# Patient Record
Sex: Male | Born: 1980 | Race: White | Hispanic: No | Marital: Single | Smoking: Current every day smoker
Health system: Southern US, Community
[De-identification: ages and names within clinical notes are randomized; demographics above are authoritative.]

## PROBLEM LIST (undated history)

## (undated) HISTORY — PX: HAND SURGERY: SHX662

---

## 2013-07-12 ENCOUNTER — Emergency Department (HOSPITAL_COMMUNITY): Payer: Self-pay

## 2013-07-12 ENCOUNTER — Emergency Department (HOSPITAL_COMMUNITY)
Admission: EM | Admit: 2013-07-12 | Discharge: 2013-07-12 | Disposition: A | Discharge status: Home / Self Care | Payer: Self-pay | Attending: Emergency Medicine | Admitting: Emergency Medicine

## 2013-07-12 ENCOUNTER — Encounter (HOSPITAL_COMMUNITY): Payer: Self-pay | Admitting: Emergency Medicine

## 2013-07-12 DIAGNOSIS — W132XXA Fall from, out of or through roof, initial encounter: Secondary | ICD-10-CM

## 2013-07-12 DIAGNOSIS — W138XXA Fall from, out of or through other building or structure, initial encounter: Secondary | ICD-10-CM | POA: Insufficient documentation

## 2013-07-12 DIAGNOSIS — S335XXA Sprain of ligaments of lumbar spine, initial encounter: Secondary | ICD-10-CM | POA: Insufficient documentation

## 2013-07-12 DIAGNOSIS — S7001XA Contusion of right hip, initial encounter: Secondary | ICD-10-CM

## 2013-07-12 DIAGNOSIS — Y9301 Activity, walking, marching and hiking: Secondary | ICD-10-CM | POA: Insufficient documentation

## 2013-07-12 DIAGNOSIS — S39012A Strain of muscle, fascia and tendon of lower back, initial encounter: Secondary | ICD-10-CM

## 2013-07-12 DIAGNOSIS — S7000XA Contusion of unspecified hip, initial encounter: Secondary | ICD-10-CM | POA: Insufficient documentation

## 2013-07-12 DIAGNOSIS — Y92009 Unspecified place in unspecified non-institutional (private) residence as the place of occurrence of the external cause: Secondary | ICD-10-CM | POA: Insufficient documentation

## 2013-07-12 MED ORDER — KETOROLAC TROMETHAMINE 60 MG/2ML IM SOLN
60.0000 mg | Freq: Once | INTRAMUSCULAR | Status: AC
  Administered 2013-07-12: 60 mg via INTRAMUSCULAR
  Filled 2013-07-12: qty 2

## 2013-07-12 MED ORDER — OXYCODONE-ACETAMINOPHEN 5-325 MG PO TABS: 1.0000 | ORAL_TABLET | Freq: Four times a day (QID) | ORAL | Status: AC | PRN

## 2013-07-12 MED ORDER — OXYCODONE-ACETAMINOPHEN 5-325 MG PO TABS
2.0000 | ORAL_TABLET | Freq: Once | ORAL | Status: AC
  Administered 2013-07-12: 2 via ORAL
  Filled 2013-07-12: qty 2

## 2013-07-12 MED ORDER — IBUPROFEN 800 MG PO TABS: 800.0000 mg | ORAL_TABLET | Freq: Three times a day (TID) | ORAL | Status: AC | PRN

## 2013-07-12 NOTE — Discharge Instructions (Signed)
Contusion A contusion is a deep bruise. Contusions are the result of an injury that caused bleeding under the skin. The contusion may turn blue, purple, or yellow. Minor injuries will give you a painless contusion, but more severe contusions may stay painful and swollen for a few weeks.  CAUSES  A contusion is usually caused by a blow, trauma, or direct force to an area of the body. SYMPTOMS   Swelling and redness of the injured area.  Bruising of the injured area.  Tenderness and soreness of the injured area.  Pain. DIAGNOSIS  The diagnosis can be made by taking a history and physical exam. An X-ray, CT scan, or MRI may be needed to determine if there were any associated injuries, such as fractures. TREATMENT  Specific treatment will depend on what area of the body was injured. In general, the best treatment for a contusion is resting, icing, elevating, and applying cold compresses to the injured area. Over-the-counter medicines may also be recommended for pain control. Ask your caregiver what the best treatment is for your contusion. HOME CARE INSTRUCTIONS   Put ice on the injured area.  Put ice in a plastic bag.  Place a towel between your skin and the bag.  Leave the ice on for 15-20 minutes, 03-04 times a day.  Only take over-the-counter or prescription medicines for pain, discomfort, or fever as directed by your caregiver. Your caregiver may recommend avoiding anti-inflammatory medicines (aspirin, ibuprofen, and naproxen) for 48 hours because these medicines may increase bruising.  Rest the injured area.  If possible, elevate the injured area to reduce swelling. SEEK IMMEDIATE MEDICAL CARE IF:   You have increased bruising or swelling.  You have pain that is getting worse.  Your swelling or pain is not relieved with medicines. MAKE SURE YOU:   Understand these instructions.  Will watch your condition.  Will get help right away if you are not doing well or get  worse. Document Released: 01/29/2005 Document Revised: 07/14/2011 Document Reviewed: 02/24/2011 [facility] Patient Information 2014 [place], [place].  Back Pain, Adult Low back pain is very common. About 1 in 5 people have back pain.The cause of low back pain is rarely dangerous. The pain often gets better over time.About half of people with a sudden onset of back pain feel better in just 2 weeks. About 8 in 10 people feel better by 6 weeks.  CAUSES Some common causes of back pain include:  Strain of the muscles or ligaments supporting the spine.  Wear and tear (degeneration) of the spinal discs.  Arthritis.  Direct injury to the back. DIAGNOSIS Most of the time, the direct cause of low back pain is not known.However, back pain can be treated effectively even when the exact cause of the pain is unknown.Answering your caregiver's questions about your overall health and symptoms is one of the most accurate ways to make sure the cause of your pain is not dangerous. If your caregiver needs more information, he or she may order lab work or imaging tests (X-rays or MRIs).However, even if imaging tests show changes in your back, this usually does not require surgery. HOME CARE INSTRUCTIONS For many people, back pain returns.Since low back pain is rarely dangerous, it is often a condition that people can learn to [facility] their own.   Remain active. It is stressful on the back to sit or stand in one place. Do not sit, drive, or stand in one place for more than 30 minutes at a time.  Take short walks on level surfaces as soon as pain allows.Try to increase the length of time you walk each day.  Do not stay in bed.Resting more than 1 or 2 days can delay your recovery.  Do not avoid exercise or work.Your body is made to move.It is not dangerous to be active, even though your back may hurt.Your back will likely heal faster if you return to being active before your pain is gone.  Pay attention  to your body when you bend and lift. Many people have less discomfortwhen lifting if they bend their knees, keep the load close to their bodies,and avoid twisting. Often, the most comfortable positions are those that put less stress on your recovering back.  Find a comfortable position to sleep. Use a firm mattress and lie on your side with your knees slightly bent. If you lie on your back, put a pillow under your knees.  Only take over-the-counter or prescription medicines as directed by your caregiver. Over-the-counter medicines to reduce pain and inflammation are often the most helpful.Your caregiver may prescribe muscle relaxant drugs.These medicines help dull your pain so you can more quickly return to your normal activities and healthy exercise.  Put ice on the injured area.  Put ice in a plastic bag.  Place a towel between your skin and the bag.  Leave the ice on for 15-20 minutes, 03-04 times a day for the first 2 to 3 days. After that, ice and heat may be alternated to reduce pain and spasms.  Ask your caregiver about trying back exercises and gentle massage. This may be of some benefit.  Avoid feeling anxious or stressed.Stress increases muscle tension and can worsen back pain.It is important to recognize when you are anxious or stressed and learn ways to manage it.Exercise is a great option. SEEK MEDICAL CARE IF:  You have pain that is not relieved with rest or medicine.  You have pain that does not improve in 1 week.  You have new symptoms.  You are generally not feeling well. SEEK IMMEDIATE MEDICAL CARE IF:   You have pain that radiates from your back into your legs.  You develop new bowel or bladder control problems.  You have unusual weakness or numbness in your arms or legs.  You develop nausea or vomiting.  You develop abdominal pain.  You feel faint. Document Released: 04/21/2005 Document Revised: 10/21/2011 Document Reviewed: 09/09/2010 [facility]  Patient Information 2014 [place].

## 2013-07-12 NOTE — ED Provider Notes (Signed)
CSN: [id number]     Arrival date & time 07/12/13  1924 History   First MD Initiated Contact with Patient 07/12/13 1933     Chief Complaint  Patient presents with  . Fall     (Consider location/radiation/quality/duration/timing/severity/associated sxs/prior Treatment) Patient is a 33 y.o. male presenting with fall.  Fall   Pt reports he was working on framing a new house, walking on the top of a wall, when he lost his balance and fell about 10 feet onto a plywood floor, landing on his R side. Complaining of severe aching pain in R hip and lower back, worse with movement. He called his wife who came to get him and then brought him to the ED for evaluation. Denies head injury or LOC.   History reviewed. No pertinent past medical history. No past surgical history on file. No family history on file. History  Substance Use Topics  . Smoking status: Not on file  . Smokeless tobacco: Not on file  . Alcohol Use: Not on file    Review of Systems  All other systems reviewed and are negative except as noted in HPI.    Allergies  Review of patient's allergies indicates no known allergies.  Home Medications  No current outpatient prescriptions on file. BP 129/89  Pulse 96  Temp(Src) 98.5 F (36.9 C) (Oral)  Resp 24  Wt 200 lb (90.719 kg)  SpO2 100% Physical Exam  Nursing note and vitals reviewed. Constitutional: He is oriented to person, place, and time. He appears well-developed and well-nourished.  HENT:  Head: Normocephalic and atraumatic.  Eyes: EOM are normal. Pupils are equal, round, and reactive to light.  Neck:  Placed in c-collar in triage, no neck pain but distracting injury, does not clear by NEXUS  Cardiovascular: Normal rate, normal heart sounds and intact distal pulses.   Pulmonary/Chest: Effort normal and breath sounds normal.  Abdominal: Bowel sounds are normal. He exhibits no distension. There is no tenderness.  Musculoskeletal: He exhibits tenderness (tender  diffuse lower back and R hip, worse with ROM leg and palpation). He exhibits no edema.  Neurological: He is alert and oriented to person, place, and time. He has normal strength. No cranial nerve deficit or sensory deficit.  Skin: Skin is warm and dry. No rash noted.  Psychiatric: He has a normal mood and affect.    ED Course  Procedures (including critical care time) Labs Review Labs Reviewed - No data to display Imaging Review Dg Lumbar Spine Complete  07/12/2013   CLINICAL DATA Lower back pain status post fall.  EXAM LUMBAR SPINE - COMPLETE 4+ VIEW  COMPARISON 05/22/2013 CT and radiographs of the lumbar spine  FINDINGS Rightward curvature of the lumbar spine may be accentuated by positioning. Multilevel degenerative changes, most pronounced at L1-2. 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 No displaced fracture or dislocation identified.  IMPRESSION Rightward curvature may be accentuated by positioning.  Multilevel degenerative change, most pronounced at L1-2, similar to prior. L5 limbus vertebrae again noted.  SIGNATURE  Electronically Signed   By: Jearld Lesch M.D.   On: 07/12/2013 21:55   Dg Hip Complete Right  07/12/2013   CLINICAL DATA Right lateral hip/pelvis pain, recent fall.  EXAM RIGHT HIP - COMPLETE 2+ VIEW  COMPARISON 05/22/2013 lumbar spine radiographs  FINDINGS Mild cortical irregularity along the right anterior inferior iliac spine is similar to prior and may reflect sequelae of remote injury. The right femur is rotated, limiting evaluation. Femoral head remains seated within the acetabulum.  No displaced fracture identified. Rami intact. SI joints symmetric.  IMPRESSION Limited by rotation. No displaced fracture of the right hip identified.  Recommend MRI if concern for a nondisplaced fracture or radiographically occult pathology persists.  SIGNATURE  Electronically Signed   By: Jearld LeschAndrew  DelGaizo M.D.   On: 07/12/2013 21:52   Ct Cervical Spine Wo Contrast  07/12/2013   CLINICAL DATA Fell from  roof approximately 10 feet, no loss of consciousness, denies striking head  EXAM CT CERVICAL SPINE WITHOUT CONTRAST  TECHNIQUE Multidetector CT imaging of the cervical spine was performed without intravenous contrast. Multiplanar CT image reconstructions were also generated.  COMPARISON None  FINDINGS Visualized skullbase intact.  Lung apices clear.  Osseous mineralization normal.  Prevertebral soft tissues normal thickness.  Vertebral body and disc space heights maintained.  No acute fracture, subluxation or bone destruction.  Soft tissues grossly unremarkable.  IMPRESSION No acute cervical spine abnormalities.  SIGNATURE  Electronically Signed   By: Ulyses SouthwardMark  Boles M.D.   On: 07/12/2013 21:04     EKG Interpretation None      MDM   Final diagnoses:  Fall from roof  Contusion of right hip  Lumbar strain    Imaging neg, collar removed. Low suspicion for occult hip fracture. Pt had initial relief with Percocet but pain returned while in xray. No improvement with toradol, but he wants to go home. He was advised to rest for a few days and return if not improving.     Charles B. Bernette MayersSheldon, MD 07/12/13 2250

## 2013-07-12 NOTE — ED Notes (Signed)
Pt in stating he fell off his roof tonight, landed on his right hip and side, c/o right hip, lower back and flank pain, denies hitting head or LOC, states roof is approx 10 ft, alert and oriented at this time, denies neck pain, c-collar applied in triage

## 2014-04-23 ENCOUNTER — Encounter (HOSPITAL_COMMUNITY): Payer: Self-pay | Admitting: Emergency Medicine

## 2014-04-23 ENCOUNTER — Emergency Department (HOSPITAL_COMMUNITY)
Admission: EM | Admit: 2014-04-23 | Discharge: 2014-04-23 | Disposition: A | Discharge status: Home / Self Care | Payer: Self-pay | Attending: Emergency Medicine | Admitting: Emergency Medicine

## 2014-04-23 ENCOUNTER — Emergency Department (HOSPITAL_COMMUNITY): Payer: Self-pay

## 2014-04-23 DIAGNOSIS — R112 Nausea with vomiting, unspecified: Secondary | ICD-10-CM | POA: Insufficient documentation

## 2014-04-23 DIAGNOSIS — Z720 Tobacco use: Secondary | ICD-10-CM | POA: Insufficient documentation

## 2014-04-23 DIAGNOSIS — R1030 Lower abdominal pain, unspecified: Secondary | ICD-10-CM | POA: Insufficient documentation

## 2014-04-23 DIAGNOSIS — R630 Anorexia: Secondary | ICD-10-CM | POA: Insufficient documentation

## 2014-04-23 DIAGNOSIS — R61 Generalized hyperhidrosis: Secondary | ICD-10-CM | POA: Insufficient documentation

## 2014-04-23 DIAGNOSIS — R109 Unspecified abdominal pain: Secondary | ICD-10-CM

## 2014-04-23 LAB — CBC WITH DIFFERENTIAL/PLATELET
BASOS ABS: 0 10*3/uL (ref 0.0–0.1)
Basophils Relative: 1 % (ref 0–1)
EOS PCT: 1 % (ref 0–5)
Eosinophils Absolute: 0.1 10*3/uL (ref 0.0–0.7)
HCT: 44.4 % (ref 39.0–52.0)
Hemoglobin: 15.9 g/dL (ref 13.0–17.0)
Lymphocytes Relative: 32 % (ref 12–46)
Lymphs Abs: 2.1 10*3/uL (ref 0.7–4.0)
MCH: 32.7 pg (ref 26.0–34.0)
MCHC: 35.8 g/dL (ref 30.0–36.0)
MCV: 91.4 fL (ref 78.0–100.0)
MONO ABS: 0.6 10*3/uL (ref 0.1–1.0)
MONOS PCT: 9 % (ref 3–12)
Neutro Abs: 3.9 10*3/uL (ref 1.7–7.7)
Neutrophils Relative %: 57 % (ref 43–77)
Platelets: 197 10*3/uL (ref 150–400)
RBC: 4.86 MIL/uL (ref 4.22–5.81)
RDW: 12.5 % (ref 11.5–15.5)
WBC: 6.6 10*3/uL (ref 4.0–10.5)

## 2014-04-23 LAB — URINALYSIS, ROUTINE W REFLEX MICROSCOPIC
Glucose, UA: NEGATIVE mg/dL
HGB URINE DIPSTICK: NEGATIVE
Leukocytes, UA: NEGATIVE
Nitrite: NEGATIVE
PH: 6.5 (ref 5.0–8.0)
Protein, ur: NEGATIVE mg/dL
SPECIFIC GRAVITY, URINE: 1.01 (ref 1.005–1.030)
Urobilinogen, UA: 1 mg/dL (ref 0.0–1.0)

## 2014-04-23 LAB — COMPREHENSIVE METABOLIC PANEL
ALBUMIN: 4.5 g/dL (ref 3.5–5.2)
ALK PHOS: 47 U/L (ref 39–117)
ALT: 29 U/L (ref 0–53)
AST: 22 U/L (ref 0–37)
Anion gap: 19 — ABNORMAL HIGH (ref 5–15)
BILIRUBIN TOTAL: 0.6 mg/dL (ref 0.3–1.2)
BUN: 15 mg/dL (ref 6–23)
CO2: 19 meq/L (ref 19–32)
CREATININE: 0.82 mg/dL (ref 0.50–1.35)
Calcium: 9.8 mg/dL (ref 8.4–10.5)
Chloride: 100 mEq/L (ref 96–112)
GFR calc non Af Amer: >90 mL/min (ref >90)
Glucose, Bld: 115 mg/dL — ABNORMAL HIGH (ref 70–99)
Potassium: 3.5 mEq/L — ABNORMAL LOW (ref 3.7–5.3)
Sodium: 138 mEq/L (ref 137–147)
Total Protein: 7.9 g/dL (ref 6.0–8.3)

## 2014-04-23 LAB — LIPASE, BLOOD: Lipase: 15 U/L (ref 11–59)

## 2014-04-23 MED ORDER — OXYCODONE-ACETAMINOPHEN 5-325 MG PO TABS: 2.0000 | ORAL_TABLET | ORAL | Status: AC | PRN

## 2014-04-23 MED ORDER — KETOROLAC TROMETHAMINE 30 MG/ML IJ SOLN
30.0000 mg | Freq: Once | INTRAMUSCULAR | Status: AC
  Administered 2014-04-23: 30 mg via INTRAVENOUS
  Filled 2014-04-23: qty 1

## 2014-04-23 MED ORDER — HYDROMORPHONE HCL 1 MG/ML IJ SOLN
1.0000 mg | Freq: Once | INTRAMUSCULAR | Status: AC
  Administered 2014-04-23: 1 mg via INTRAVENOUS
  Filled 2014-04-23: qty 1

## 2014-04-23 MED ORDER — IOHEXOL 300 MG/ML  SOLN
100.0000 mL | Freq: Once | INTRAMUSCULAR | Status: AC | PRN
  Administered 2014-04-23: 100 mL via INTRAVENOUS

## 2014-04-23 MED ORDER — SODIUM CHLORIDE 0.9 % IV BOLUS (SEPSIS)
500.0000 mL | Freq: Once | INTRAVENOUS | Status: AC
  Administered 2014-04-23: 500 mL via INTRAVENOUS

## 2014-04-23 MED ORDER — ONDANSETRON HCL 4 MG/2ML IJ SOLN
4.0000 mg | Freq: Once | INTRAMUSCULAR | Status: AC
  Administered 2014-04-23: 4 mg via INTRAVENOUS

## 2014-04-23 MED ORDER — PROMETHAZINE HCL 25 MG PO TABS: 25.0000 mg | ORAL_TABLET | Freq: Four times a day (QID) | ORAL | Status: AC | PRN

## 2014-04-23 MED ORDER — PROMETHAZINE HCL 25 MG/ML IJ SOLN
12.5000 mg | Freq: Once | INTRAMUSCULAR | Status: AC
  Administered 2014-04-23: 12.5 mg via INTRAVENOUS
  Filled 2014-04-23: qty 1

## 2014-04-23 MED ORDER — ONDANSETRON HCL 4 MG/2ML IJ SOLN
4.0000 mg | Freq: Once | INTRAMUSCULAR | Status: DC
  Filled 2014-04-23: qty 2

## 2014-04-23 MED ORDER — HYDROMORPHONE HCL 1 MG/ML IJ SOLN: 1.0000 mg | Freq: Once | INTRAMUSCULAR | Status: DC

## 2014-04-23 MED ORDER — SODIUM CHLORIDE 0.9 % IV BOLUS (SEPSIS)
1000.0000 mL | Freq: Once | INTRAVENOUS | Status: AC
  Administered 2014-04-23: 1000 mL via INTRAVENOUS

## 2014-04-23 MED ORDER — ONDANSETRON HCL 4 MG/2ML IJ SOLN
4.0000 mg | Freq: Once | INTRAMUSCULAR | Status: AC
  Administered 2014-04-23: 4 mg via INTRAVENOUS
  Filled 2014-04-23: qty 2

## 2014-04-23 MED ORDER — SODIUM CHLORIDE 0.9 % IV BOLUS (SEPSIS): 1000.0000 mL | Freq: Once | INTRAVENOUS | Status: DC

## 2014-04-23 MED ORDER — MORPHINE SULFATE 4 MG/ML IJ SOLN
4.0000 mg | Freq: Once | INTRAMUSCULAR | Status: AC
  Administered 2014-04-23: 4 mg via INTRAVENOUS
  Filled 2014-04-23: qty 1

## 2014-04-23 NOTE — Discharge Instructions (Signed)
CT scan showed no acute findings. Medication for pain and nausea.  Return if worse

## 2014-04-23 NOTE — ED Notes (Signed)
Patient upset about waiting, MD at bedside to explain results. Patient aggreeable to getting fluids and providing urine sample.

## 2014-04-23 NOTE — ED Notes (Addendum)
Patient upset about negative test results, feels he is not getting help,  ripped IV out, got dressed, being disrespectul to staff and making a scene around nurses station. Security called. EDP aware.

## 2014-04-23 NOTE — ED Provider Notes (Signed)
CSN: [id number]     Arrival date & time 04/23/14  0802 History  This chart was scribed for Daniel HutchingBrian Torez Beauregard, MD by Ronney LionSuzanne Le, ED Scribe. This patient was seen in room APA18/APA18 and the patient's care was started at 8:21 AM.    Chief Complaint  Patient presents with  . Abdominal Pain   The history is provided by the patient and a significant other. No language interpreter was used.    HPI Comments: Daniel SarksRandy Calhoun is a 33 y.o. male who presents to the Emergency Department complaining of intermittent, severe, central lower abdominal pain that began 3 days ago. Per fiancee, [facility] [place] after vomiting all day and experiencing the same pain as today. He was given blood tests and an XR chest; his fiancee states that he was given no CT scan. He went home, and his pain and vomiting resolved somewhat over the next 2 days, although he still had loss of appetite. Patient's pain returned today. Patient's fiancee states that he has experienced this same pain in the past, and he was told he had food poisoning. Patient has chronic back pain and is a smoker; otherwise, he is healthy in general, per fiancee. [name] denies a history of DM, cancer, kidney stones, or abdominal surgery. She denies any medication or known allergies to medication. She denies injury to the area. She denies EtOH consumption. Patient denies flank and testicular pain.       Pain appears to be worse after a bowel movement. This is happened 2 other times before.   History reviewed. No pertinent past medical history. Past Surgical History  Procedure Laterality Date  . Hand surgery     History reviewed. No pertinent family history. History  Substance Use Topics  . Smoking status: Current Every Day Smoker -- 1.50 packs/day for 15 years    Types: Cigarettes  . Smokeless tobacco: Never Used  . Alcohol Use: No    Review of Systems  Constitutional: Positive for diaphoresis and appetite  change.  Gastrointestinal: Positive for nausea, vomiting and abdominal pain.  Genitourinary: Negative for flank pain and testicular pain.  All other systems reviewed and are negative.     Allergies  Review of patient's allergies indicates no known allergies.  Home Medications   Prior to Admission medications   Medication Sig Start Date End Date Taking? Authorizing Provider  ibuprofen (ADVIL,MOTRIN) 800 MG tablet Take 1 tablet (800 mg total) by mouth every 8 (eight) hours as needed. Patient not taking: Reported on 04/23/2014 07/12/13   Charles B. Bernette MayersSheldon, MD  oxyCODONE-acetaminophen (PERCOCET) 5-325 MG per tablet Take 2 tablets by mouth every 4 (four) hours as needed. 04/23/14   Daniel HutchingBrian Winola Drum, MD  oxyCODONE-acetaminophen (PERCOCET/ROXICET) 5-325 MG per tablet Take 1-2 tablets by mouth every 6 (six) hours as needed for severe pain. Patient not taking: Reported on 04/23/2014 07/12/13   Charles B. Bernette MayersSheldon, MD  promethazine (PHENERGAN) 25 MG tablet Take 1 tablet (25 mg total) by mouth every 6 (six) hours as needed. 04/23/14   Daniel HutchingBrian Sander Remedios, MD   BP 101/51 mmHg  Pulse 67  Temp(Src) 98.7 F (37.1 C) (Oral)  Resp 0  Ht 5\' 10"  (1.778 m)  Wt 180 lb (81.647 kg)  BMI 25.83 kg/m2  SpO2 100% Physical Exam  Constitutional: He is oriented to person, place, and time. He appears well-developed and well-nourished.  HENT:  Head: Normocephalic and atraumatic.  Eyes: Conjunctivae and EOM are  normal. Pupils are equal, round, and reactive to light.  Neck: Normal range of motion. Neck supple.  Cardiovascular: Normal rate, regular rhythm and normal heart sounds.   Pulmonary/Chest: Effort normal and breath sounds normal.  Abdominal: Soft. Bowel sounds are normal. There is tenderness.  Tenderness to central lower abdomen.  Musculoskeletal: Normal range of motion. He exhibits no tenderness.  No tenderness to lower back or flank.  Neurological: He is alert and oriented to person, place, and time.  Skin: Skin  is warm and dry.  Psychiatric: He has a normal mood and affect. His behavior is normal.  Nursing note and vitals reviewed.   ED Course  Procedures (including critical care time)  DIAGNOSTIC STUDIES: Oxygen Saturation is 100% on room air, normal by my interpretation.    COORDINATION OF CARE: 8:27 AM - Discussed treatment plan with pt at bedside which includes CT abdomen and pt agreed to plan.   Labs Review Labs Reviewed  COMPREHENSIVE METABOLIC PANEL - Abnormal; Notable for the following:    Potassium 3.5 (*)    Glucose, Bld 115 (*)    Anion gap 19 (*)    All other components within normal limits  CBC WITH DIFFERENTIAL  LIPASE, BLOOD  URINALYSIS, ROUTINE W REFLEX MICROSCOPIC    Imaging Review Ct Abdomen Pelvis W Contrast  04/23/2014   CLINICAL DATA:  Intermittent central lower abdominal pain x3 days  EXAM: CT ABDOMEN AND PELVIS WITH CONTRAST  TECHNIQUE: Multidetector CT imaging of the abdomen and pelvis was performed using the standard protocol following bolus administration of intravenous contrast.  CONTRAST:  100mL OMNIPAQUE IOHEXOL 300 MG/ML  SOLN  COMPARISON:  None.  FINDINGS: Lower chest:  Lung bases are clear.  Hepatobiliary: Mildly heterogeneous perfusion liver without suspicious enhancing lesion 6 x 16 mm calcified lesion overlying the posterior right hepatic lobe (series 2/ image 12), benign.  Gallbladder is unremarkable. No intrahepatic or extrahepatic ductal dilatation.  Pancreas: Within normal limits.  Spleen: Within normal limits.  Adrenals/Urinary Tract: Adrenal gland are unremarkable.  Kidneys within normal limits.  No hydronephrosis.  Bladder is underdistended but unremarkable.  Stomach/Bowel: Stomach is unremarkable.  No evidence of bowel obstruction.  Normal appendix.  Vascular/Lymphatic: No evidence of abdominal aortic aneurysm.  No suspicious abdominopelvic lymphadenopathy.  Reproductive: Grossly unremarkable by CT. Mildly heterogeneous enhancement of the left  peripheral gland, nonspecific.  Other: No abdominopelvic ascites.  Musculoskeletal: Mild degenerative changes of the visualized thoracolumbar spine. Limbus vertebra along the anterior superior corner of L5.  IMPRESSION: No evidence of bowel obstruction.  Normal appendix.  No CT findings to account for the patient's severe lower abdominal pain.   Electronically Signed   By: Charline BillsSriyesh  Krishnan M.D.   On: 04/23/2014 10:16     EKG Interpretation None      MDM   Final diagnoses:  Abdominal pain   Patient has normal vital signs. CT scan shows no obvious bowel production. Labs normal. Patient feels better after pain management. Discussed findings with patient and his wife. Patient wanted to leave before urinalysis was finished  I personally performed the services described in this documentation, which was scribed in my presence. The recorded information has been reviewed and is accurate.       Daniel HutchingBrian Arvin Abello, MD 04/23/14 [id number]

## 2014-04-23 NOTE — ED Notes (Signed)
Patient c/o lower abd pain that radiates into right testicle since Friday. Patient states pain started when patient went to bathroom to have BM. Per wife diaphoretic.  Per wife nausea and vomiting constantly. Wife states that patient went to ER on Thursday, was unable to find anything but patient was asked if he had kidney stones. Patient dry heaving and moaning in pain.

## 2015-05-06 IMAGING — CT CT ABD-PELV W/ CM
2 of 4 series · 16 of 46 positions shown, 18 images · IV contrast (Omnipaque 300)
Comparison: None.

CLINICAL DATA: Intermittent central lower abdominal pain x3 days

EXAM:
CT ABDOMEN AND PELVIS WITH CONTRAST
TECHNIQUE: Multidetector CT imaging of the abdomen and pelvis was performed
using the standard protocol following bolus administration of
intravenous contrast.
CONTRAST:  100mL OMNIPAQUE IOHEXOL 300 MG/ML  SOLN

[Series 2: abd_pel_with 5.0 b40f · axial · 0.82mm/px · z∈[-488,-63]mm · 13 of 95 slices shown, 15 images]
[im 5/95  soft-tissue]
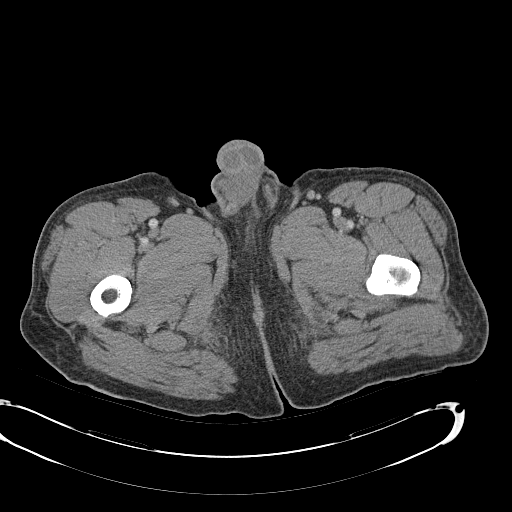
[im 5/95  bone]
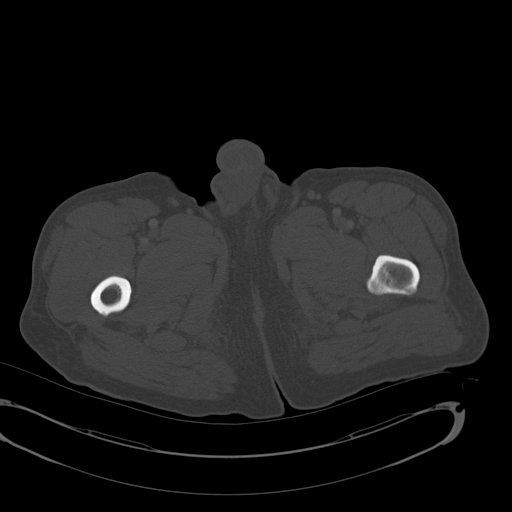
[im 14/95  soft-tissue]
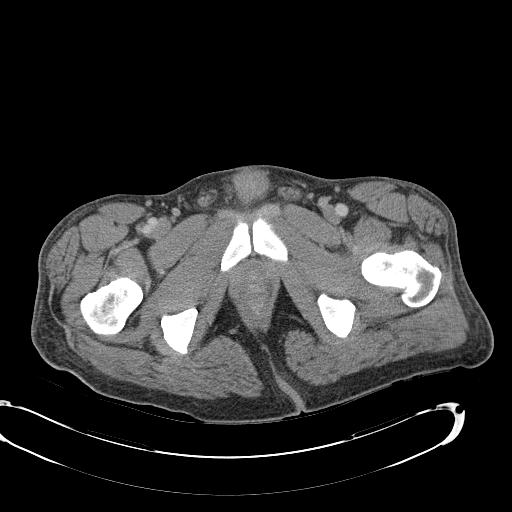
[im 18/95  soft-tissue]
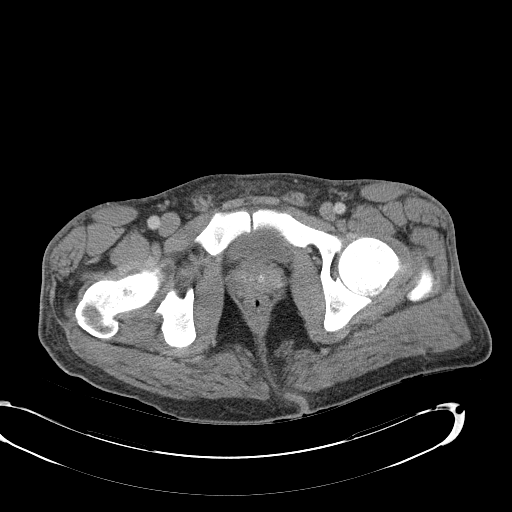
[im 27/95  soft-tissue]
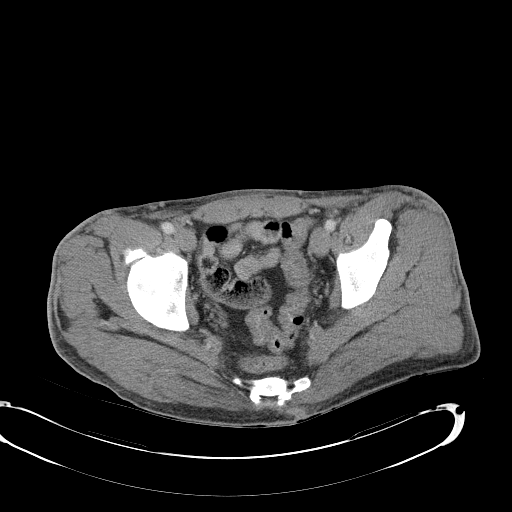
[im 32/95  soft-tissue]
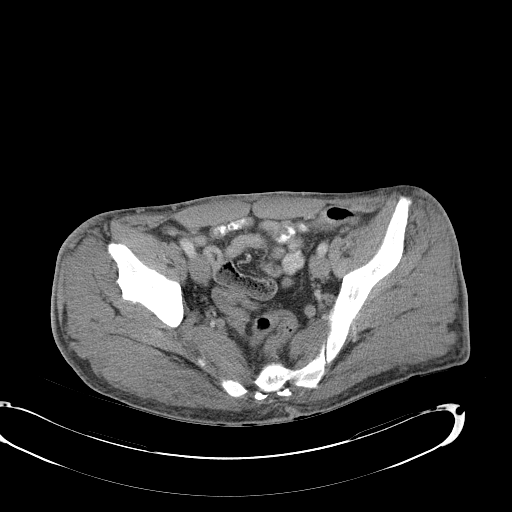
[im 41/95  soft-tissue]
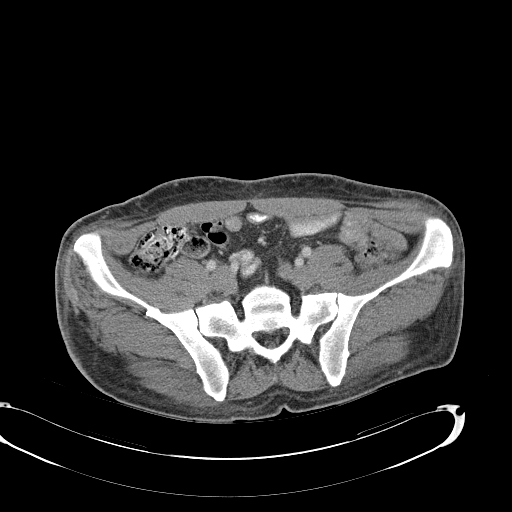
[im 50/95  soft-tissue]
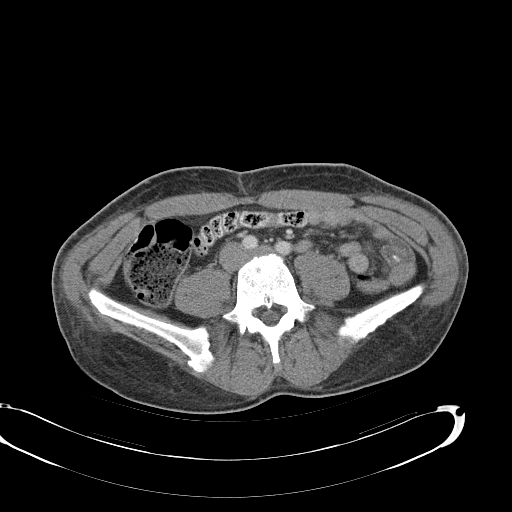
[im 54/95  soft-tissue]
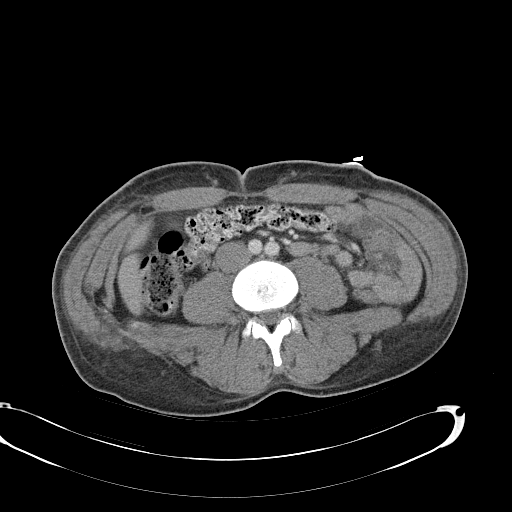
[im 63/95  soft-tissue]
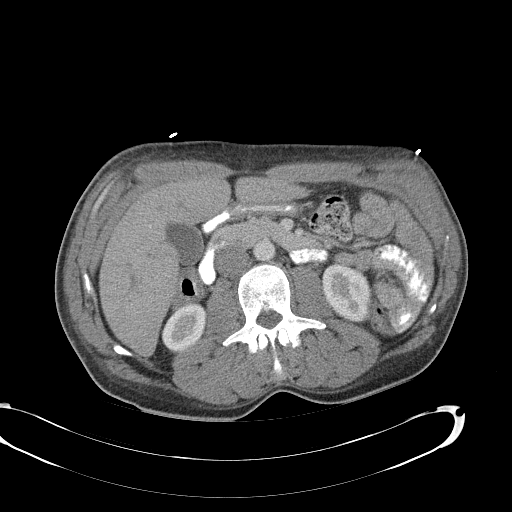
[im 63/95  bone]
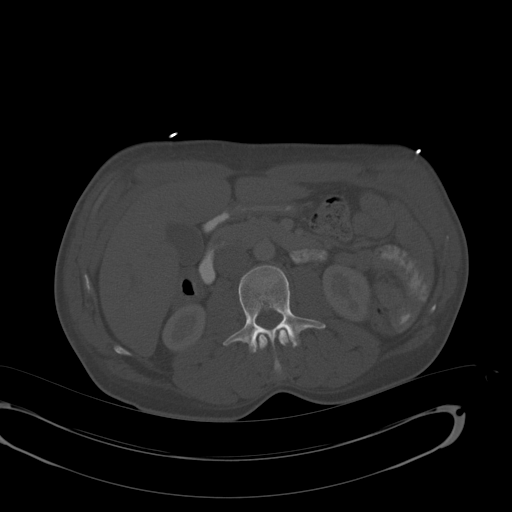
[im 68/95  soft-tissue]
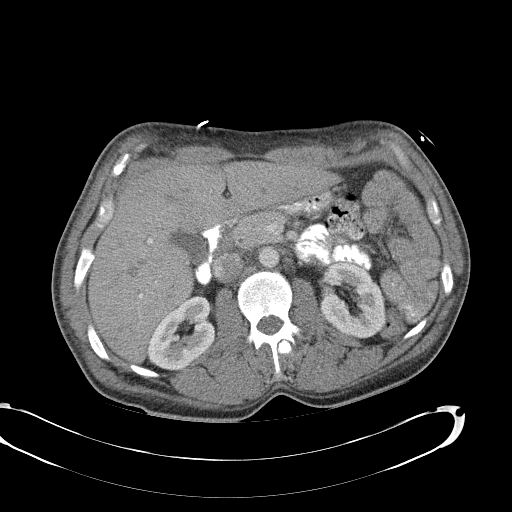
[im 77/95  soft-tissue]
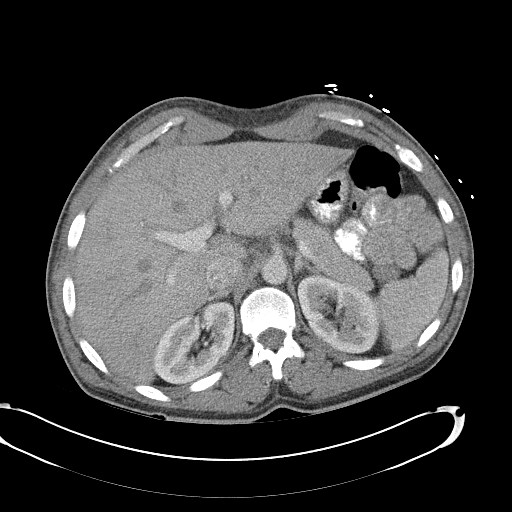
[im 81/95  soft-tissue]
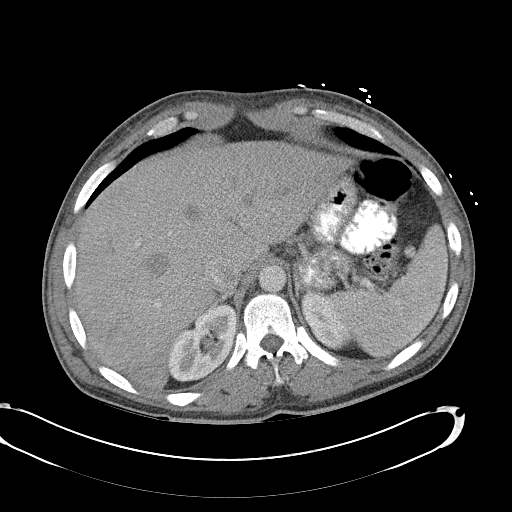
[im 90/95  soft-tissue]
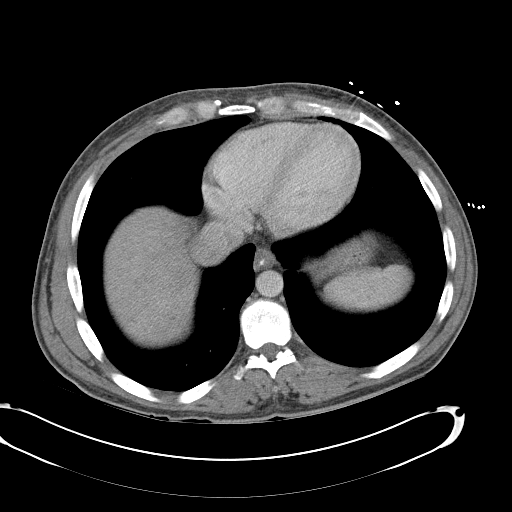

[Series 3: abd_pel_with 3.0 spo cor · coronal · 0.78mm/px · 3 of 82 slices shown]
[im 28/82  soft-tissue]
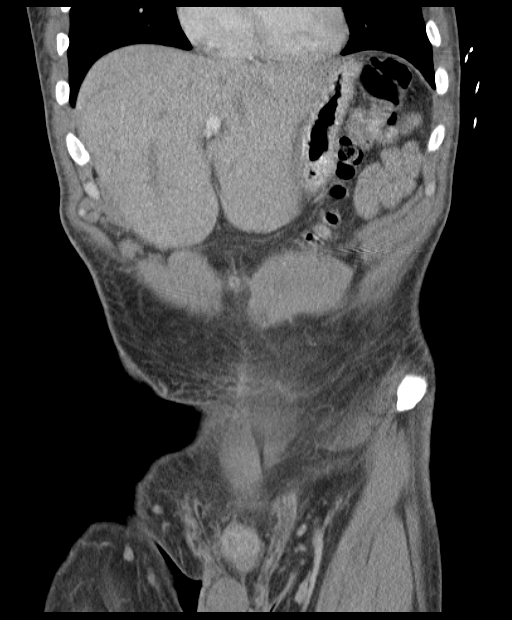
[im 37/82  soft-tissue]
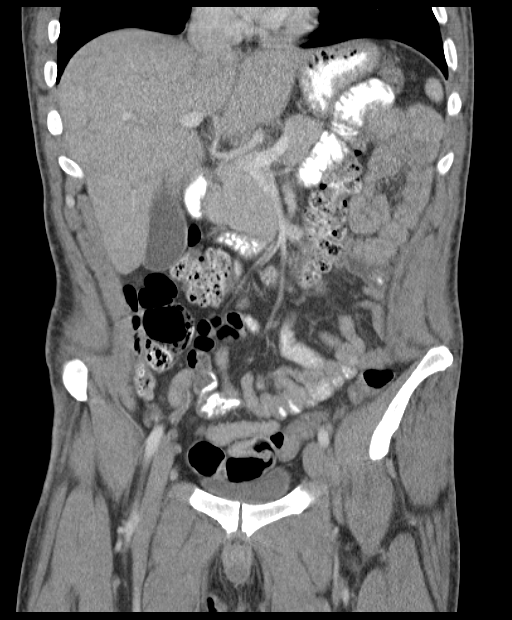
[im 46/82  soft-tissue]
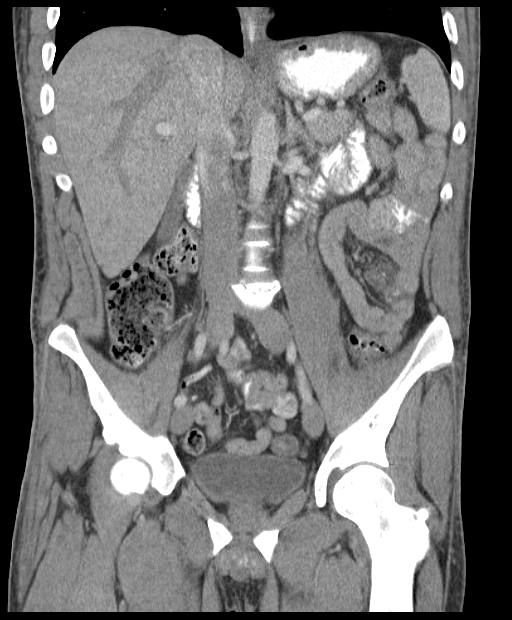

[16 of 46 positions shown; findings below may reference images not displayed]

FINDINGS: Lower chest:  Lung bases are clear.

Hepatobiliary: Mildly heterogeneous perfusion liver without
suspicious enhancing lesion 6 x 16 mm calcified lesion overlying the
posterior right hepatic lobe (series 2/ image 12), benign.

Gallbladder is unremarkable. No intrahepatic or extrahepatic ductal
dilatation.

Pancreas: Within normal limits.

Spleen: Within normal limits.

Adrenals/Urinary Tract: Adrenal gland are unremarkable.

Kidneys within normal limits.  No hydronephrosis.

Bladder is underdistended but unremarkable.

Stomach/Bowel: Stomach is unremarkable.

No evidence of bowel obstruction.

Normal appendix.

Vascular/Lymphatic: No evidence of abdominal aortic aneurysm.

No suspicious abdominopelvic lymphadenopathy.

Reproductive: Grossly unremarkable by CT. Mildly heterogeneous
enhancement of the left peripheral gland, nonspecific.

Other: No abdominopelvic ascites.

Musculoskeletal: Mild degenerative changes of the visualized
thoracolumbar spine. Limbus vertebra along the anterior superior
corner of L5.
IMPRESSION: No evidence of bowel obstruction.  Normal appendix.

No CT findings to account for the patient's severe lower abdominal
pain.

## 2021-12-03 DEATH — deceased
